# Patient Record
Sex: Female | Born: 1980 | Race: Black or African American | Hispanic: No | Marital: Single | Smoking: Never smoker
Health system: Southern US, Community
[De-identification: ages and names within clinical notes are randomized; demographics above are authoritative.]

## PROBLEM LIST (undated history)

## (undated) DIAGNOSIS — I10 Essential (primary) hypertension: Secondary | ICD-10-CM

---

## 2015-04-07 ENCOUNTER — Emergency Department (HOSPITAL_BASED_OUTPATIENT_CLINIC_OR_DEPARTMENT_OTHER)
Admission: EM | Admit: 2015-04-07 | Discharge: 2015-04-07 | Disposition: A | Discharge status: Home / Self Care | Payer: Medicaid Other | Attending: Emergency Medicine | Admitting: Emergency Medicine

## 2015-04-07 ENCOUNTER — Encounter (HOSPITAL_BASED_OUTPATIENT_CLINIC_OR_DEPARTMENT_OTHER): Payer: Self-pay | Admitting: *Deleted

## 2015-04-07 DIAGNOSIS — R200 Anesthesia of skin: Secondary | ICD-10-CM | POA: Diagnosis not present

## 2015-04-07 DIAGNOSIS — G5601 Carpal tunnel syndrome, right upper limb: Secondary | ICD-10-CM | POA: Diagnosis not present

## 2015-04-07 DIAGNOSIS — M25531 Pain in right wrist: Secondary | ICD-10-CM | POA: Diagnosis present

## 2015-04-07 MED ORDER — IBUPROFEN 600 MG PO TABS: 600.0000 mg | ORAL_TABLET | Freq: Four times a day (QID) | ORAL | Status: AC | PRN

## 2015-04-07 NOTE — Discharge Instructions (Signed)

## 2015-04-07 NOTE — ED Notes (Signed)
Presents with rt wrist pain with tingling in digits

## 2015-04-07 NOTE — ED Notes (Signed)
Rt wrist pain, x 2 weeks, pain in digits with numbness that radiates to rt wrist

## 2015-04-07 NOTE — ED Notes (Signed)
Wrist splint applied to rt wrist per EDP orders, pt teaching done re: application and use of splint, pt verbalized understanding of splint, ice pack also provided, pt teaching done re: time limit use on ice usage, also alt methods for pain control, including ice, rest, splint and elevation

## 2015-04-07 NOTE — ED Provider Notes (Signed)
CSN: [id number]     Arrival date & time 04/07/15  7829 History   First MD Initiated Contact with Patient 04/07/15 [phone]     Chief Complaint  Patient presents with  . Wrist Pain     (Consider location/radiation/quality/duration/timing/severity/associated sxs/prior Treatment) Patient is a 34 y.o. female presenting with wrist pain. The history is provided by the patient.  Wrist Pain This is a new problem. Episode onset: 2 weeks ago. The problem occurs constantly. The problem has not changed since onset.Pertinent negatives include no abdominal pain. Exacerbated by: Repetitive movement working in [employer]. Nothing relieves the symptoms. She has tried nothing for the symptoms.    History reviewed. No pertinent past medical history. History reviewed. No pertinent past surgical history. No family history on file. Social History  Substance Use Topics  . Smoking status: Never Smoker   . Smokeless tobacco: None  . Alcohol Use: No   OB History    No data available     Review of Systems  Gastrointestinal: Negative for abdominal pain.  All other systems reviewed and are negative.     Allergies  Review of patient's allergies indicates no known allergies.  Home Medications   Prior to Admission medications   Medication Sig Start Date End Date Taking? Authorizing Provider  ibuprofen (ADVIL,MOTRIN) 600 MG tablet Take 1 tablet (600 mg total) by mouth every 6 (six) hours as needed for mild pain. 04/07/15   Lyndal Pulley, MD   BP 146/93 mmHg  Pulse 90  Temp(Src) 98.7 F (37.1 C) (Oral)  Resp 18  Ht  (1.575 m)  Wt 200 lb (90.719 kg)  BMI 36.57 kg/m2  SpO2 100% Physical Exam  Constitutional: She is oriented to person, place, and time. She appears well-developed and well-nourished. No distress.  HENT:  Head: Normocephalic.  Eyes: Conjunctivae are normal.  Neck: Neck supple. No tracheal deviation present.  Cardiovascular: Normal rate and regular rhythm.    Pulmonary/Chest: Effort normal. No respiratory distress.  Abdominal: Soft. She exhibits no distension.  Musculoskeletal:       Right wrist: She exhibits tenderness (with percussion of carpal tunnel eliciting symptoms).  Neurological: She is alert and oriented to person, place, and time.  Skin: Skin is warm and dry.  Psychiatric: She has a normal mood and affect.    ED Course  Procedures (including critical care time) Labs Review Labs Reviewed - No data to display  Imaging Review No results found. I have personally reviewed and evaluated these images and lab results as part of my medical decision-making.   EKG Interpretation None      MDM   Final diagnoses:  Acute carpal tunnel syndrome, right    34 year old female presents with reproducible wrist pain, finger numbness, and radiating pain up her right arm. Signs and symptoms consistent with uncomplicated carpal tunnel syndrome. Recommended NSAIDs, splinting, close PCP follow-up and surgical consultation if conservative therapy is failed.    Lyndal Pulley, MD 04/07/15 [phone]

## 2015-04-10 ENCOUNTER — Emergency Department (HOSPITAL_BASED_OUTPATIENT_CLINIC_OR_DEPARTMENT_OTHER)
Admission: EM | Admit: 2015-04-10 | Discharge: 2015-04-10 | Disposition: A | Discharge status: Home / Self Care | Payer: Medicaid Other | Attending: Emergency Medicine | Admitting: Emergency Medicine

## 2015-04-10 ENCOUNTER — Emergency Department (HOSPITAL_BASED_OUTPATIENT_CLINIC_OR_DEPARTMENT_OTHER): Payer: Medicaid Other

## 2015-04-10 ENCOUNTER — Encounter (HOSPITAL_BASED_OUTPATIENT_CLINIC_OR_DEPARTMENT_OTHER): Payer: Self-pay

## 2015-04-10 DIAGNOSIS — N390 Urinary tract infection, site not specified: Secondary | ICD-10-CM

## 2015-04-10 DIAGNOSIS — O2331 Infections of other parts of urinary tract in pregnancy, first trimester: Secondary | ICD-10-CM | POA: Diagnosis not present

## 2015-04-10 DIAGNOSIS — Z3490 Encounter for supervision of normal pregnancy, unspecified, unspecified trimester: Secondary | ICD-10-CM

## 2015-04-10 DIAGNOSIS — O9989 Other specified diseases and conditions complicating pregnancy, childbirth and the puerperium: Secondary | ICD-10-CM | POA: Diagnosis present

## 2015-04-10 DIAGNOSIS — R109 Unspecified abdominal pain: Secondary | ICD-10-CM

## 2015-04-10 DIAGNOSIS — Z3A01 Less than 8 weeks gestation of pregnancy: Secondary | ICD-10-CM | POA: Diagnosis not present

## 2015-04-10 LAB — URINE MICROSCOPIC-ADD ON

## 2015-04-10 LAB — HCG, QUANTITATIVE, PREGNANCY: hCG, Beta Chain, Quant, S: 10771 m[IU]/mL — ABNORMAL HIGH (ref <5)

## 2015-04-10 LAB — URINALYSIS, ROUTINE W REFLEX MICROSCOPIC
BILIRUBIN URINE: NEGATIVE
Glucose, UA: NEGATIVE mg/dL
Hgb urine dipstick: NEGATIVE
KETONES UR: 15 mg/dL — AB
NITRITE: NEGATIVE
PROTEIN: NEGATIVE mg/dL
Specific Gravity, Urine: 1.021 (ref 1.005–1.030)
UROBILINOGEN UA: 1 mg/dL (ref 0.0–1.0)
pH: 6 (ref 5.0–8.0)

## 2015-04-10 LAB — PREGNANCY, URINE: PREG TEST UR: POSITIVE — AB

## 2015-04-10 MED ORDER — CEPHALEXIN 500 MG PO CAPS: 500.0000 mg | ORAL_CAPSULE | Freq: Two times a day (BID) | ORAL | Status: AC

## 2015-04-10 NOTE — ED Provider Notes (Signed)
CSN: [id number]     Arrival date & time 04/10/15  1108 History   First MD Initiated Contact with Patient 04/10/15 1336     Chief Complaint  Patient presents with  . Abdominal Pain     (Consider location/radiation/quality/duration/timing/severity/associated sxs/prior Treatment) HPI Comments: Patient is a 34 year old female who presents to the ED with complaint of lower abdominal pain, onset 3 days. Patient reports having intermittent lower abdominal cramping. Denies any aggravating or alleviating factors. Denies fever, chills, headache, SOB, CP, cough, sore throat, nausea, vomiting, diarrhea, vaginal bleeding, vaginal discharge, urinary symptoms, blood in urine or stool. Patient reports she took a home pregnancy test yesterday that was positive. She has not followed up with OB/GYN yet. Denies any prior abdominal surgeries. Patient notes she has been taking ibuprofen at home with mild relief. LMP 9/4.   Patient is a 34 y.o. female presenting with abdominal pain.  Abdominal Pain   History reviewed. No pertinent past medical history. History reviewed. No pertinent past surgical history. No family history on file. Social History  Substance Use Topics  . Smoking status: Never Smoker   . Smokeless tobacco: None  . Alcohol Use: No   OB History    No data available     Review of Systems  Gastrointestinal: Positive for abdominal pain.  All other systems reviewed and are negative.     Allergies  Review of patient's allergies indicates no known allergies.  Home Medications   Prior to Admission medications   Medication Sig Start Date End Date Taking? Authorizing Provider  ibuprofen (ADVIL,MOTRIN) 600 MG tablet Take 1 tablet (600 mg total) by mouth every 6 (six) hours as needed for mild pain. 04/07/15   Lyndal Pulleyaniel Knott, MD   BP 143/93 mmHg  Pulse 102  Temp(Src) 99.4 F (37.4 C) (Oral)  Resp 18  Ht 5\' 2"  (1.575 m)  Wt 200 lb (90.719 kg)  BMI 36.57 kg/m2  SpO2 100%  LMP  02/28/2015 Physical Exam  Constitutional: She is oriented to person, place, and time. She appears well-developed and well-nourished.  HENT:  Head: Normocephalic and atraumatic.  Mouth/Throat: Oropharynx is clear and moist. No oropharyngeal exudate.  Eyes: Conjunctivae and EOM are normal. Pupils are equal, round, and reactive to light. Right eye exhibits no discharge. Left eye exhibits no discharge. No scleral icterus.  Neck: Normal range of motion. Neck supple.  Cardiovascular: Normal rate, regular rhythm, normal heart sounds and intact distal pulses.   Pulmonary/Chest: Effort normal and breath sounds normal. No respiratory distress. She has no wheezes. She has no rales. She exhibits no tenderness.  Abdominal: Soft. Bowel sounds are normal. She exhibits no distension and no mass. There is tenderness (RLQ, LLQ, suprapubic). There is no rebound and no guarding.  Musculoskeletal: Normal range of motion. She exhibits no edema or tenderness.  Lymphadenopathy:    She has no cervical adenopathy.  Neurological: She is alert and oriented to person, place, and time.  Skin: Skin is warm and dry.  Nursing note and vitals reviewed.   ED Course  Procedures (including critical care time) Labs Review Labs Reviewed  URINALYSIS, ROUTINE W REFLEX MICROSCOPIC (NOT AT [facility]) - Abnormal; Notable for the following:    APPearance CLOUDY (*)    Ketones, ur 15 (*)    Leukocytes, UA MODERATE (*)    All other components within normal limits  PREGNANCY, URINE - Abnormal; Notable for the following:    Preg Test, Ur POSITIVE (*)    All other components within normal  limits  URINE MICROSCOPIC-ADD ON - Abnormal; Notable for the following:    Squamous Epithelial / LPF MANY (*)    Bacteria, UA FEW (*)    All other components within normal limits    Imaging Review No results found. I have personally reviewed and evaluated these images and lab results as part of my medical decision-making.  Filed Vitals:    04/10/15 1614  BP: 122/69  Pulse: 90  Temp: 99 F (37.2 C)  Resp: 18     MDM   Final diagnoses:  Pregnant  UTI (lower urinary tract infection)    Patient presents with lower abdominal cramping. LMP 9/4. Denies urinary symptoms, vaginal bleeding, vaginal discharge, fever. Mild relief with ibuprofen at home. Exam revealed mild TTP at lower abdominal quadrants, no peritoneal signs. Urine pregnancy positive. HCG Quant [id number]. UA consistent with UTI. Ultrasound ultrasound revealed intrauterine gestational sac with visible yolk sac but embryo not yet visible. Discussed results with patient. Plan to discharge patient home with antibiotics for UTI and advised patient to follow up with OB/GYN this week.  Evaluation does not show pathology requring ongoing emergent intervention or admission. Pt is hemodynamically stable and mentating appropriately. Discussed findings/results and plan with patient/guardian, who agrees with plan. All questions answered. Return precautions discussed and outpatient follow up given.      Satira Sark [place], [place] 04/10/15 1654  Vanetta Mulders, MD 04/11/15 [phone]

## 2015-04-10 NOTE — Discharge Instructions (Signed)
Take your medications as prescribe for 10 days for UTI. You may also take over the counter Ibuprofen for abdominal pain as needed. Please follow up with an OBGYN in the next week. Please return to the Emergency Department if symptoms worsen or new onset of fever, vaginal bleeding, vomiting.

## 2015-04-10 NOTE — ED Notes (Addendum)
C/o abd pain x 3 days-denies n/v/d, and vaginal d/c-+dysuria-positive home preg test

## 2015-04-10 NOTE — ED Notes (Signed)
Patient transported to Ultrasound 

## 2015-07-11 ENCOUNTER — Encounter (HOSPITAL_BASED_OUTPATIENT_CLINIC_OR_DEPARTMENT_OTHER): Payer: Self-pay | Admitting: *Deleted

## 2015-07-11 ENCOUNTER — Emergency Department (HOSPITAL_BASED_OUTPATIENT_CLINIC_OR_DEPARTMENT_OTHER)
Admission: EM | Admit: 2015-07-11 | Discharge: 2015-07-11 | Disposition: A | Discharge status: Home / Self Care | Payer: Medicaid Other | Attending: Emergency Medicine | Admitting: Emergency Medicine

## 2015-07-11 DIAGNOSIS — R109 Unspecified abdominal pain: Secondary | ICD-10-CM | POA: Diagnosis not present

## 2015-07-11 DIAGNOSIS — Z3A18 18 weeks gestation of pregnancy: Secondary | ICD-10-CM | POA: Diagnosis not present

## 2015-07-11 DIAGNOSIS — R102 Pelvic and perineal pain: Secondary | ICD-10-CM | POA: Diagnosis not present

## 2015-07-11 DIAGNOSIS — O9989 Other specified diseases and conditions complicating pregnancy, childbirth and the puerperium: Secondary | ICD-10-CM | POA: Diagnosis present

## 2015-07-11 DIAGNOSIS — N949 Unspecified condition associated with female genital organs and menstrual cycle: Secondary | ICD-10-CM

## 2015-07-11 LAB — URINALYSIS, ROUTINE W REFLEX MICROSCOPIC
Bilirubin Urine: NEGATIVE
Glucose, UA: NEGATIVE mg/dL
HGB URINE DIPSTICK: NEGATIVE
Ketones, ur: NEGATIVE mg/dL
Nitrite: NEGATIVE
PH: 6.5 (ref 5.0–8.0)
PROTEIN: NEGATIVE mg/dL
SPECIFIC GRAVITY, URINE: 1.014 (ref 1.005–1.030)

## 2015-07-11 LAB — URINE MICROSCOPIC-ADD ON: RBC / HPF: NONE SEEN RBC/hpf (ref 0–5)

## 2015-07-11 NOTE — ED Notes (Signed)
[redacted] weeks pregnant. C.o abdominal pain and pressure in her left abdomen when she urinates. Denies vaginal leakage.

## 2015-07-11 NOTE — Discharge Instructions (Signed)
How to Take a Sitz Bath  A sitz bath is a warm water bath that is taken while you are sitting down. The water should only come up to your hips and should cover your buttocks. Your health care provider may recommend a sitz bath to help you:   · Clean the lower part of your body, including your genital area.  · With itching.  · With pain.  · With sore muscles or muscles that tighten or spasm.  HOW TO TAKE A SITZ BATH  Take 3-4 sitz baths per day or as told by your health care provider.  1. Partially fill a bathtub with warm water. You will only need the water to be deep enough to cover your hips and buttocks when you are sitting in it.  2. If your health care provider told you to put medicine in the water, follow the directions exactly.  3. Sit in the water and open the tub drain a little.  4. Turn on the warm water again to keep the tub at the correct level. Keep the water running constantly.  5. Soak in the water for 15-20 minutes or as told by your health care provider.  6. After the sitz bath, pat the affected area dry first. Do not rub it.  7. Be careful when you stand up after the sitz bath because you may feel dizzy.  SEEK MEDICAL CARE IF:  · Your symptoms get worse. Do not continue with sitz baths if your symptoms get worse.  · You have new symptoms. Do not continue with sitz baths until you talk with your health care provider.     This information is not intended to replace advice given to you by your health care provider. Make sure you discuss any questions you have with your health care provider.     Document Released: 03/07/2004 Document Revised: 10/30/2014 Document Reviewed: 06/13/2014  Elsevier Interactive Patient Education ©2016 [employer]

## 2015-07-11 NOTE — ED Provider Notes (Signed)
CSN: [id number]     Arrival date & time 07/11/15  1520 History   First MD Initiated Contact with Patient 07/11/15 1600     Chief Complaint  Patient presents with  . Abdominal Pain     HPI [redacted] weeks pregnant. C.o abdominal pain and pressure in her left abdomen when she urinates. Denies vaginal leakage.  Patient has had an ultrasound done on this pregnancy which shows an intrauterine pregnancy.  Patient denies any fever or chills. History reviewed. No pertinent past medical history. History reviewed. No pertinent past surgical history. No family history on file. Social History  Substance Use Topics  . Smoking status: Never Smoker   . Smokeless tobacco: None  . Alcohol Use: No   OB History    Gravida Para Term Preterm AB TAB SAB Ectopic Multiple Living   1              Review of Systems    Allergies  Review of patient's allergies indicates no known allergies.  Home Medications   Prior to Admission medications   Medication Sig Start Date End Date Taking? Authorizing Provider  Prenatal Multivit-Min-Fe-FA (PRENATAL VITAMINS PO) Take by mouth.   Yes Historical Provider, MD  ibuprofen (ADVIL,MOTRIN) 600 MG tablet Take 1 tablet (600 mg total) by mouth every 6 (six) hours as needed for mild pain. 04/07/15   Lyndal Pulleyaniel Knott, MD   BP 142/90 mmHg  Pulse 100  Temp(Src) 98.4 F (36.9 C) (Oral)  Resp 20  Ht 5' (1.524 m)  Wt 200 lb (90.719 kg)  BMI 39.06 kg/m2  SpO2 98%  LMP 03/03/2015 Physical Exam  Constitutional: She is oriented to person, place, and time. She appears well-developed and well-nourished. No distress.  HENT:  Head: Normocephalic and atraumatic.  Eyes: Pupils are equal, round, and reactive to light.  Neck: Normal range of motion.  Cardiovascular: Normal rate and intact distal pulses.   Pulmonary/Chest: No respiratory distress.  Abdominal: Normal appearance. She exhibits no distension. There is no rebound and no CVA tenderness.    Musculoskeletal: Normal range of  motion.  Neurological: She is alert and oriented to person, place, and time. No cranial nerve deficit.  Skin: Skin is warm and dry. No rash noted.  Psychiatric: She has a normal mood and affect. Her behavior is normal.  Nursing note and vitals reviewed.   ED Course  Procedures (including critical care time) Labs Review Labs Reviewed  URINALYSIS, ROUTINE W REFLEX MICROSCOPIC (NOT AT [facility]) - Abnormal; Notable for the following:    Leukocytes, UA TRACE (*)    All other components within normal limits  URINE MICROSCOPIC-ADD ON - Abnormal; Notable for the following:    Squamous Epithelial / LPF 6-30 (*)    Bacteria, UA RARE (*)    All other components within normal limits  URINE CULTURE    Imaging Review No results found. I have personally reviewed and evaluated these images and lab results as part of my medical decision-making.    MDM   Final diagnoses:  Round ligament pain        Nelva Nayobert Tramayne Sebesta, MD 07/11/15 1727

## 2015-07-14 LAB — URINE CULTURE: SPECIAL REQUESTS: NORMAL

## 2016-07-22 ENCOUNTER — Emergency Department (HOSPITAL_BASED_OUTPATIENT_CLINIC_OR_DEPARTMENT_OTHER)
Admission: EM | Admit: 2016-07-22 | Discharge: 2016-07-22 | Disposition: A | Discharge status: Home / Self Care | Payer: Medicaid Other | Attending: Emergency Medicine | Admitting: Emergency Medicine

## 2016-07-22 ENCOUNTER — Encounter (HOSPITAL_BASED_OUTPATIENT_CLINIC_OR_DEPARTMENT_OTHER): Payer: Self-pay | Admitting: *Deleted

## 2016-07-22 DIAGNOSIS — I10 Essential (primary) hypertension: Secondary | ICD-10-CM | POA: Insufficient documentation

## 2016-07-22 DIAGNOSIS — R519 Headache, unspecified: Secondary | ICD-10-CM

## 2016-07-22 DIAGNOSIS — R51 Headache: Secondary | ICD-10-CM | POA: Insufficient documentation

## 2016-07-22 HISTORY — DX: Essential (primary) hypertension: I10

## 2016-07-22 MED ORDER — METOCLOPRAMIDE HCL 10 MG PO TABS
10.0000 mg | ORAL_TABLET | Freq: Once | ORAL | Status: AC
  Administered 2016-07-22: 10 mg via ORAL
  Filled 2016-07-22: qty 1

## 2016-07-22 MED ORDER — OXYCODONE-ACETAMINOPHEN 5-325 MG PO TABS
1.0000 | ORAL_TABLET | ORAL | Status: DC | PRN
  Administered 2016-07-22: 1 via ORAL
  Filled 2016-07-22: qty 1

## 2016-07-22 MED ORDER — KETOROLAC TROMETHAMINE 60 MG/2ML IM SOLN
60.0000 mg | Freq: Once | INTRAMUSCULAR | Status: AC
  Administered 2016-07-22: 60 mg via INTRAMUSCULAR
  Filled 2016-07-22: qty 2

## 2016-07-22 MED ORDER — DIPHENHYDRAMINE HCL 25 MG PO CAPS
25.0000 mg | ORAL_CAPSULE | Freq: Once | ORAL | Status: AC
  Administered 2016-07-22: 25 mg via ORAL
  Filled 2016-07-22: qty 1

## 2016-07-22 NOTE — ED Provider Notes (Signed)
[facility]   CSN: [id number] Arrival date & time: 07/22/16  1008     History   Chief Complaint Chief Complaint  Patient presents with  . Headache    HPI Veronica Henry is a 36 y.o. female presenting with 3 days of pounding unilateral headache. She has tried tylenol without relief. She states that she has had this type of headache in the past, but not in a while. She was given percocet in triage and improved but still had a headache. She endorses photophobia, denies nausea, vomiting, or other symptoms.  HPI  Past Medical History:  Diagnosis Date  . Hypertension     There are no active problems to display for this patient.   History reviewed. No pertinent surgical history.  OB History    Gravida Para Term Preterm AB Living   1             SAB TAB Ectopic Multiple Live Births                   Home Medications    Prior to Admission medications   Medication Sig Start Date End Date Taking? Authorizing Provider  ibuprofen (ADVIL,MOTRIN) 600 MG tablet Take 1 tablet (600 mg total) by mouth every 6 (six) hours as needed for mild pain. 04/07/15   Lyndal Pulley, MD    Family History History reviewed. No pertinent family history.  Social History Social History  Substance Use Topics  . Smoking status: Never Smoker  . Smokeless tobacco: Never Used  . Alcohol use No     Allergies   Patient has no known allergies.   Review of Systems Review of Systems  Constitutional: Negative for chills and fever.  HENT: Negative for congestion, ear pain, facial swelling, sinus pain, sinus pressure and sore throat.   Eyes: Positive for photophobia. Negative for pain and visual disturbance.  Respiratory: Negative for cough, shortness of breath, wheezing and stridor.   Cardiovascular: Negative for chest pain and palpitations.  Gastrointestinal: Negative for abdominal distention, abdominal pain, diarrhea, nausea and vomiting.  Genitourinary: Negative  for dysuria and hematuria.  Musculoskeletal: Negative for arthralgias, back pain, gait problem, myalgias, neck pain and neck stiffness.  Skin: Negative for color change, pallor and rash.  Neurological: Positive for headaches. Negative for dizziness, seizures, syncope, facial asymmetry, speech difficulty, weakness, light-headedness and numbness.  All other systems reviewed and are negative.    Physical Exam Updated Vital Signs BP 130/92 (BP Location: Right Arm)   Pulse 72   Temp 97.7 F (36.5 C) (Oral)   Resp 18   Ht 5' (1.524 m)   Wt 95.3 kg   LMP 07/16/2016   SpO2 99%   BMI 41.01 kg/m   Physical Exam  Constitutional: She appears well-developed and well-nourished. No distress.  Afebrile, nontoxic-appearing, sitting comfortably in bed in no acute distress.  HENT:  Head: Normocephalic and atraumatic.  Mouth/Throat: Oropharynx is clear and moist. No oropharyngeal exudate.  Eyes: Conjunctivae and EOM are normal. Pupils are equal, round, and reactive to light. Right eye exhibits no discharge. Left eye exhibits no discharge.  Neck: Normal range of motion. Neck supple.  Cardiovascular: Normal rate, regular rhythm and normal heart sounds.   No murmur heard. Pulmonary/Chest: Effort normal and breath sounds normal. No respiratory distress. She has no wheezes. She has no rales.  Musculoskeletal: She exhibits no edema.  Neurological: She is alert. No cranial nerve deficit or sensory deficit. She exhibits normal muscle tone. Coordination  normal.  Skin: Skin is warm and dry. No rash noted. She is not diaphoretic. No pallor.  Psychiatric: She has a normal mood and affect. Her behavior is normal.  Nursing note and vitals reviewed.    ED Treatments / Results  Labs (all labs ordered are listed, but only abnormal results are displayed) Labs Reviewed - No data to display  EKG  EKG Interpretation None       Radiology No results found.  Procedures Procedures (including critical  care time)  Medications Ordered in ED Medications  ketorolac (TORADOL) injection 60 mg (60 mg Intramuscular Given 07/22/16 1251)  metoCLOPramide (REGLAN) tablet 10 mg (10 mg Oral Given 07/22/16 1250)  diphenhydrAMINE (BENADRYL) capsule 25 mg (25 mg Oral Given 07/22/16 1250)     Initial Impression / Assessment and Plan / ED Course  I have reviewed the triage vital signs and the nursing notes.  Pertinent labs & imaging results that were available during my care of the patient were reviewed by me and considered in my medical decision making (see chart for details).    Patient present with unilateral headache with photophobia. No aura. Reassuring exam. No gross focal neuro-deficits. She was afebrile, non-toxic appearing and in no acute distress. No meningeal signs.  On reassessment, patient reported feeling much better. Her condition overall improved while in the ED. Vitals stable. She was ready to go home.  Discharge home with close follow up with PCP. Patient was provided with resources to establish care. Advised to remain hydrated and emphasized importance of follow up with primary care.  Discussed strict return precautions. Patient was advised to return to the emergency department if experiencingworsening or new concerning symptoms. She understood instructions and agreed with discharge plan.   Final Clinical Impressions(s) / ED Diagnoses   Final diagnoses:  Acute nonintractable headache, unspecified headache type    New Prescriptions Discharge Medication List as of 07/22/2016  1:42 PM       Georgiana ShoreJessica B Sederick Jacobsen, PA-C 07/23/16 1450    Jerelyn ScottMartha Linker, MD 07/23/16 [phone]

## 2016-07-22 NOTE — ED Triage Notes (Signed)
Pt reports headache with photophobia x Sunday, states she has had the same kind of headache in the past, but has not been seen by a neurologist.

## 2017-02-12 ENCOUNTER — Encounter (HOSPITAL_BASED_OUTPATIENT_CLINIC_OR_DEPARTMENT_OTHER): Payer: Self-pay

## 2017-02-12 ENCOUNTER — Emergency Department (HOSPITAL_BASED_OUTPATIENT_CLINIC_OR_DEPARTMENT_OTHER)
Admission: EM | Admit: 2017-02-12 | Discharge: 2017-02-12 | Disposition: A | Discharge status: Home / Self Care | Payer: Medicaid Other | Attending: Emergency Medicine | Admitting: Emergency Medicine

## 2017-02-12 DIAGNOSIS — G43009 Migraine without aura, not intractable, without status migrainosus: Secondary | ICD-10-CM | POA: Insufficient documentation

## 2017-02-12 DIAGNOSIS — I10 Essential (primary) hypertension: Secondary | ICD-10-CM | POA: Insufficient documentation

## 2017-02-12 MED ORDER — DIPHENHYDRAMINE HCL 25 MG PO CAPS
25.0000 mg | ORAL_CAPSULE | Freq: Once | ORAL | Status: AC
  Administered 2017-02-12: 25 mg via ORAL
  Filled 2017-02-12: qty 1

## 2017-02-12 MED ORDER — METOCLOPRAMIDE HCL 10 MG PO TABS
10.0000 mg | ORAL_TABLET | Freq: Once | ORAL | Status: AC
  Administered 2017-02-12: 10 mg via ORAL
  Filled 2017-02-12: qty 1

## 2017-02-12 MED ORDER — KETOROLAC TROMETHAMINE 30 MG/ML IJ SOLN
30.0000 mg | Freq: Once | INTRAMUSCULAR | Status: AC
  Administered 2017-02-12: 30 mg via INTRAMUSCULAR
  Filled 2017-02-12: qty 1

## 2017-02-12 NOTE — ED Triage Notes (Signed)
Pt reports severe headache since last night states that she feels it may be due to her high blood pressure, cannot afford her medication. Denies vomiting. Endorses nausea and light sensitivity.

## 2017-02-12 NOTE — Discharge Instructions (Signed)
You were seen in the ED for a migraine headache and treated with medications. Please call and establish with a primary care doctor, contact information is below for some options of where to go.

## 2017-02-12 NOTE — ED Provider Notes (Signed)
Pt seen and evaluated. D/W Dr. Artist Pais. Pt with Bilateral throbbing frontal headache similar to previous Ha. Normal exam-Hypertensive, mild. Responded well to Toradol in January.  Plan:  Medicate, re-eval of sx and htn.    [name], MD 02/12/17 [phone]

## 2017-02-12 NOTE — ED Provider Notes (Signed)
[facility] Provider [facility]   CSN: [id number] Arrival date & time: 02/12/17  8182     History   Chief Complaint Chief Complaint  Patient presents with  . Headache    HPI Veronica Henry is a 36 y.o. female.  Patient 36 yo F presents to ED with c/o HA. Started last night, diffusely across front going back, pounding sensation. Has some light sensitivity and ringing in Henry ears. Has HA similar to this about 1 time per month, usually relieved by tylenol but this episode has persisted. Also having some nausea but no vomiting. Thinks related to high BP because when she gets these checks BP at home and find it to be elevated, this morning was 145/97. No vision changes, numbness, weakness, tingling. States last time had HA to this severity was relieved by toradol.      Past Medical History:  Diagnosis Date  . Hypertension     There are no active problems to display for this patient.   History reviewed. No pertinent surgical history.  OB History    Gravida Para Term Preterm AB Living   1             SAB TAB Ectopic Multiple Live Births                   Home Medications    Prior to Admission medications   Medication Sig Start Date End Date Taking? Authorizing Provider  ibuprofen (ADVIL,MOTRIN) 600 MG tablet Take 1 tablet (600 mg total) by mouth every 6 (six) hours as needed for mild pain. 04/07/15   Lyndal Pulley, MD    Family History No family history on file.  Social History Social History  Substance Use Topics  . Smoking status: Never Smoker  . Smokeless tobacco: Never Used  . Alcohol use No     Allergies   Patient has no known allergies.   Review of Systems Review of Systems  Constitutional: Negative for chills and fever.  Eyes: Positive for photophobia. Negative for visual disturbance.  Respiratory: Negative for shortness of breath.   Cardiovascular: Negative for chest pain.  Gastrointestinal: Positive for nausea. Negative for abdominal  pain, constipation, diarrhea and vomiting.  Neurological: Positive for headaches. Negative for dizziness, weakness, light-headedness and numbness.     Physical Exam Updated Vital Signs BP (!) 155/104   Pulse 80   Temp 98.4 F (36.9 C) (Oral)   Resp 16   Ht 5' (1.524 m)   Wt 90.7 kg (200 lb)   LMP 01/29/2017   SpO2 100%   Breastfeeding? Unknown   BMI 39.06 kg/m   Physical Exam  Constitutional: She is oriented to person, place, and time. She appears well-developed and well-nourished. No distress.  HENT:  Head: Normocephalic and atraumatic.  Nose: Nose normal.  Eyes: Pupils are equal, round, and reactive to light. Conjunctivae and EOM are normal.  Neck: Normal range of motion. Neck supple.  Cardiovascular: Normal rate, regular rhythm, normal heart sounds and intact distal pulses.   No murmur heard. Pulmonary/Chest: Effort normal and breath sounds normal. No respiratory distress. She has no wheezes.  Abdominal: Soft. Bowel sounds are normal. She exhibits no distension. There is no tenderness. There is no rebound and no guarding.  Musculoskeletal: Normal range of motion. She exhibits no edema.  Lymphadenopathy:    She has no cervical adenopathy.  Neurological: She is alert and oriented to person, place, and time. She displays normal reflexes. No cranial nerve deficit or sensory  deficit. She exhibits normal muscle tone. Coordination normal.  Skin: Skin is warm and dry. Capillary refill takes less than 2 seconds.  Psychiatric: She has a normal mood and affect.     ED Treatments / Results  Labs (all labs ordered are listed, but only abnormal results are displayed) Labs Reviewed - No data to display  EKG  EKG Interpretation None       Radiology No results found.  Procedures Procedures (including critical care time)  Medications Ordered in ED Medications  ketorolac (TORADOL) 30 MG/ML injection 30 mg (not administered)  diphenhydrAMINE (BENADRYL) capsule 25 mg (not  administered)  metoCLOPramide (REGLAN) tablet 10 mg (not administered)     Initial Impression / Assessment and Plan / ED Course  I have reviewed the triage vital signs and the nursing notes.  Pertinent labs & imaging results that were available during my care of the patient were reviewed by me and considered in my medical decision making (see chart for details).   Patient is a 36 yo F who presented to ED with frontal headache with photophobia and nausea similar to last ED visit. No neuro findings on exam and overall well appearing. Given toradol, reglan and benadryl with relief. BP recheck 132/90 manually. Discussed establishing with a PCP to follow up as outpatient.   Final Clinical Impressions(s) / ED Diagnoses   Final diagnoses:  Migraine without aura and without status migrainosus, not intractable    New Prescriptions New Prescriptions   No medications on file     Veronica Her, DO 02/12/17 4098    Veronica Porter, MD 02/20/17 [phone]

## 2017-07-07 ENCOUNTER — Emergency Department (HOSPITAL_BASED_OUTPATIENT_CLINIC_OR_DEPARTMENT_OTHER)
Admission: EM | Admit: 2017-07-07 | Discharge: 2017-07-07 | Disposition: A | Discharge status: Home / Self Care | Payer: Self-pay | Attending: Emergency Medicine | Admitting: Emergency Medicine

## 2017-07-07 ENCOUNTER — Other Ambulatory Visit: Payer: Self-pay

## 2017-07-07 ENCOUNTER — Encounter (HOSPITAL_BASED_OUTPATIENT_CLINIC_OR_DEPARTMENT_OTHER): Payer: Self-pay | Admitting: Emergency Medicine

## 2017-07-07 DIAGNOSIS — J069 Acute upper respiratory infection, unspecified: Secondary | ICD-10-CM | POA: Insufficient documentation

## 2017-07-07 DIAGNOSIS — I10 Essential (primary) hypertension: Secondary | ICD-10-CM | POA: Insufficient documentation

## 2017-07-07 NOTE — ED Triage Notes (Signed)
States has had a nose bleed at hs since last Thursday. Also concerned about BP being high, at work it was 154/111, this am. Was sent over from work

## 2017-07-07 NOTE — ED Provider Notes (Signed)
[facility] EMERGENCY DEPARTMENT Provider Note   CSN: [id number] Arrival date & time: 07/07/17  [id number]     History   Chief Complaint Chief Complaint  Patient presents with  . Hypertension    nose bleed     HPI Veronica Henry is a 37 y.o. female.  Patient has had cold with cough sneeze and rhinorrhea for the past 3 days.  She reports her nosebleeds from both sides when she blows her nose.  Last had nosebleed 4 AM today.  She is presently asymptomatic except for nasal congestion.  No treatment prior to coming here.  She took her blood pressure this morning at work which was noted to be 154/111.  She is been told about elevated blood pressure in the past however has not been on medication.  HPI  Past Medical History:  Diagnosis Date  . Hypertension     There are no active problems to display for this patient.   History reviewed. No pertinent surgical history.  OB History    Gravida Para Term Preterm AB Living   1             SAB TAB Ectopic Multiple Live Births                   Home Medications    Prior to Admission medications   Medication Sig Start Date End Date Taking? Authorizing Provider  ibuprofen (ADVIL,MOTRIN) 600 MG tablet Take 1 tablet (600 mg total) by mouth every 6 (six) hours as needed for mild pain. 04/07/15   Lyndal PulleyKnott, Daniel, MD    Family History No family history on file.  Social History Social History   Tobacco Use  . Smoking status: Never Smoker  . Smokeless tobacco: Never Used  Substance Use Topics  . Alcohol use: Yes    Comment: social  . Drug use: No   Positive marijuana smoker no other illicit drug use  Allergies   Patient has no known allergies.   Review of Systems Review of Systems  Constitutional: Negative.   HENT: Positive for congestion, nosebleeds and sneezing.   Respiratory: Positive for cough.   Cardiovascular: Negative.   Gastrointestinal: Negative.   Musculoskeletal: Negative.   Skin: Negative.     Neurological: Negative.   Psychiatric/Behavioral: Negative.   All other systems reviewed and are negative.    Physical Exam Updated Vital Signs BP 134/87 (BP Location: Right Arm)   Pulse 85   Temp (!) 97.5 F (36.4 C) (Oral)   Resp 18   LMP 06/24/2017 (Exact Date)   SpO2 99%   Physical Exam  Constitutional: She is oriented to person, place, and time. She appears well-developed and well-nourished.  HENT:  Head: Normocephalic and atraumatic.  Nose inspected with headlamp and nasal speculum.  No bleeding site identified.  No dried blood at nares  Eyes: Conjunctivae are normal. Pupils are equal, round, and reactive to light.  Neck: Neck supple. No tracheal deviation present. No thyromegaly present.  Cardiovascular: Normal rate and regular rhythm.  No murmur heard. Pulmonary/Chest: Effort normal and breath sounds normal.  Abdominal: Soft. Bowel sounds are normal. She exhibits no distension. There is no tenderness.  Obese  Musculoskeletal: Normal range of motion. She exhibits no edema or tenderness.  Lymphadenopathy:    She has no cervical adenopathy.  Neurological: She is alert and oriented to person, place, and time. Coordination normal.  Skin: Skin is warm and dry. No rash noted.  Psychiatric: She has a normal mood  and affect.  Nursing note and vitals reviewed.    ED Treatments / Results  Labs (all labs ordered are listed, but only abnormal results are displayed) Labs Reviewed - No data to display  EKG  EKG Interpretation None       Radiology No results found.  Procedures Procedures (including critical care time)  Medications Ordered in ED Medications - No data to display   Initial Impression / Assessment and Plan / ED Course  I have reviewed the triage vital signs and the nursing notes.  Pertinent labs & imaging results that were available during my care of the patient were reviewed by me and considered in my medical decision making (see chart for  details).     I counseled patient for 5 minutes on smoking cessation I offered patient prescription for anti-hypertensive medication which she declined.  She would rather see primary care physician.  Okay with that his blood pressure has normalized.  Plan referral primary care  Final Clinical Impressions(s) / ED Diagnoses   Final diagnoses:  Hypertension, unspecified type  Upper respiratory tract infection, unspecified type   ED Discharge Orders    None       Doug Sou, MD 07/07/17 [phone]

## 2017-07-07 NOTE — Discharge Instructions (Signed)
Your blood pressure here today was 134/87.  Call the number on these instructions to get a primary care physician your blood pressure should be rechecked within the next 3 weeks.  Avoid marijuana, as smoking leads to colds another types of respiratory infections.  If your nose rebleeds pinch her nostrils shut for 20 minutes while sitting upright in a chair.  If you cannot get the bleeding to stop return to the emergency department

## 2018-06-05 ENCOUNTER — Other Ambulatory Visit: Payer: Self-pay

## 2018-06-05 ENCOUNTER — Encounter (HOSPITAL_BASED_OUTPATIENT_CLINIC_OR_DEPARTMENT_OTHER): Payer: Self-pay | Admitting: Emergency Medicine

## 2018-06-05 ENCOUNTER — Emergency Department (HOSPITAL_BASED_OUTPATIENT_CLINIC_OR_DEPARTMENT_OTHER)
Admission: EM | Admit: 2018-06-05 | Discharge: 2018-06-05 | Disposition: A | Discharge status: Home / Self Care | Payer: Worker's Compensation | Attending: Emergency Medicine | Admitting: Emergency Medicine

## 2018-06-05 DIAGNOSIS — S68626D Partial traumatic transphalangeal amputation of right little finger, subsequent encounter: Secondary | ICD-10-CM | POA: Insufficient documentation

## 2018-06-05 DIAGNOSIS — X58XXXD Exposure to other specified factors, subsequent encounter: Secondary | ICD-10-CM | POA: Diagnosis not present

## 2018-06-05 DIAGNOSIS — I10 Essential (primary) hypertension: Secondary | ICD-10-CM | POA: Insufficient documentation

## 2018-06-05 DIAGNOSIS — Z5189 Encounter for other specified aftercare: Secondary | ICD-10-CM

## 2018-06-05 MED ORDER — HYDROCODONE-ACETAMINOPHEN 5-325 MG PO TABS: 1.0000 | ORAL_TABLET | Freq: Four times a day (QID) | ORAL | 0 refills | Status: AC | PRN

## 2018-06-05 MED ORDER — CEPHALEXIN 500 MG PO CAPS: 500.0000 mg | ORAL_CAPSULE | Freq: Two times a day (BID) | ORAL | 0 refills | Status: AC

## 2018-06-05 MED ORDER — CEPHALEXIN 500 MG PO CAPS: 500.0000 mg | ORAL_CAPSULE | Freq: Two times a day (BID) | ORAL | 0 refills | Status: DC

## 2018-06-05 NOTE — ED Provider Notes (Signed)
[facility] EMERGENCY DEPARTMENT Provider Note   CSN: [id number] Arrival date & time: 06/05/18  1345     History   Chief Complaint Chief Complaint  Patient presents with  . Wound Check    HPI Veronica Henry is a 37 y.o. female with history of hypertension who presents with right small finger injury that occurred 5 days ago.  She had a distal fingertip amputation.  She saw the hand surgeon on Friday and was told it was healing well.  She woke up today with pain and drainage to the end of the digit.  She is currently taking Keflex.  She denies any fevers.  She reports that the drainage is improved now.  Patient saw orthopedic doctor at [facility].  She was initially evaluated at [facility].  HPI  Past Medical History:  Diagnosis Date  . Hypertension     There are no active problems to display for this patient.   History reviewed. No pertinent surgical history.   OB History    Gravida  1   Para      Term      Preterm      AB      Living        SAB      TAB      Ectopic      Multiple      Live Births               Home Medications    Prior to Admission medications   Medication Sig Start Date End Date Taking? Authorizing Provider  cephALEXin (KEFLEX) 500 MG capsule Take 1 capsule (500 mg total) by mouth 2 (two) times daily for 3 days. 06/05/18 06/08/18  Emi Holes, PA-C  HYDROcodone-acetaminophen (NORCO/VICODIN) 5-325 MG tablet Take 1-2 tablets by mouth every 6 (six) hours as needed. 06/05/18   Brier Firebaugh, Waylan Boga, PA-C  ibuprofen (ADVIL,MOTRIN) 600 MG tablet Take 1 tablet (600 mg total) by mouth every 6 (six) hours as needed for mild pain. 04/07/15   Lyndal Pulley, MD    Family History No family history on file.  Social History Social History   Tobacco Use  . Smoking status: Never Smoker  . Smokeless tobacco: Never Used  Substance Use Topics  . Alcohol use: Yes    Comment: social  . Drug use: No     Allergies    Patient has no known allergies.   Review of Systems Review of Systems  Musculoskeletal: Positive for arthralgias.  Skin: Positive for wound.     Physical Exam Updated Vital Signs BP (!) 165/85 (BP Location: Right Arm)   Pulse 77   Temp 98.6 F (37 C) (Oral)   Resp 16   Ht 5\' 2"  (1.575 m)   Wt 90.7 kg   LMP 05/30/2018   SpO2 100%   BMI 36.58 kg/m   Physical Exam  Constitutional: She appears well-developed and well-nourished. No distress.  HENT:  Head: Normocephalic and atraumatic.  Mouth/Throat: Oropharynx is clear and moist. No oropharyngeal exudate.  Eyes: Pupils are equal, round, and reactive to light. Conjunctivae are normal. Right eye exhibits no discharge. Left eye exhibits no discharge. No scleral icterus.  Neck: Normal range of motion. Neck supple. No thyromegaly present.  Cardiovascular: Normal rate, regular rhythm, normal heart sounds and intact distal pulses. Exam reveals no gallop and no friction rub.  No murmur heard. Pulmonary/Chest: Effort normal and breath sounds normal. No stridor. No respiratory distress. She  has no wheezes. She has no rales.  Abdominal: Soft. Bowel sounds are normal. She exhibits no distension. There is no tenderness. There is no rebound and no guarding.  Musculoskeletal: She exhibits no edema.  Healing right fifth distal fingertip amputation, no significant drainage, no erythema proximally, see photo  Lymphadenopathy:    She has no cervical adenopathy.  Neurological: She is alert. Coordination normal.  Skin: Skin is warm and dry. No rash noted. She is not diaphoretic. No pallor.  Psychiatric: She has a normal mood and affect.  Nursing note and vitals reviewed.          ED Treatments / Results  Labs (all labs ordered are listed, but only abnormal results are displayed) Labs Reviewed - No data to display  EKG None  Radiology No results found.  Procedures Procedures (including critical care time)  Medications Ordered  in ED Medications - No data to display   Initial Impression / Assessment and Plan / ED Course  I have reviewed the triage vital signs and the nursing notes.  Pertinent labs & imaging results that were available during my care of the patient were reviewed by me and considered in my medical decision making (see chart for details).     Patient with recent distal fingertip amputation her right fifth digit.  She was evaluated orthopedics 2 days ago and told wound was healing well.  Based on patient's picture she showed me of the wound earlier this morning, there did appear to be a little bit of yellow drainage, however no purulent drainage on my evaluation.  There is no surrounding erythema.  Patient is afebrile.  Will extend Keflex.  Follow-up to orthopedics as planned.  We will also provide more pain medication this is probably the reason patient's pain returned but she stopped taking the pain medication the day prior.  Continue ibuprofen and for severe pain, Norco.  I reviewed the [place] narcotic database and found no discrepancies.  Return precautions given.  Patient understands and agrees with plan.  Patient vitals stable throughout ED course and discharged in satisfactory condition. I discussed patient case with Dr. Juleen ChinaKohut who guided the patient's management and agrees with plan.   Final Clinical Impressions(s) / ED Diagnoses   Final diagnoses:  Visit for wound check    ED Discharge Orders         Ordered    cephALEXin (KEFLEX) 500 MG capsule  2 times daily,   Status:  Discontinued     06/05/18 1739    HYDROcodone-acetaminophen (NORCO/VICODIN) 5-325 MG tablet  Every 6 hours PRN     06/05/18 1739    cephALEXin (KEFLEX) 500 MG capsule  2 times daily     06/05/18 1948           Emi HolesLaw, Kitty Cadavid M, PA-C 06/05/18 1951    Raeford RazorKohut, Stephen, MD 06/09/18 [phone]

## 2018-06-05 NOTE — ED Triage Notes (Signed)
Pt reports R pinky finger earlier this week, she saw her dr on Friday and was told it was healing well. Today she woke up with pain and drainage.

## 2018-06-05 NOTE — Discharge Instructions (Signed)
Continue changing dressing daily.  Continue Keflex until completed.  Take Norco every 6 hours as prescribed, as needed for severe pain.  Take ibuprofen for mild to moderate pain.  Please follow-up with orthopedic doctor as planned.  Please have your doctor recheck your blood pressure during next visit.  It is most likely elevated due to pain.

## 2020-05-29 ENCOUNTER — Emergency Department (HOSPITAL_BASED_OUTPATIENT_CLINIC_OR_DEPARTMENT_OTHER)
Admission: EM | Admit: 2020-05-29 | Discharge: 2020-05-29 | Disposition: A | Discharge status: Home / Self Care | Payer: Self-pay | Attending: Emergency Medicine | Admitting: Emergency Medicine

## 2020-05-29 ENCOUNTER — Other Ambulatory Visit: Payer: Self-pay

## 2020-05-29 ENCOUNTER — Encounter (HOSPITAL_BASED_OUTPATIENT_CLINIC_OR_DEPARTMENT_OTHER): Payer: Self-pay

## 2020-05-29 DIAGNOSIS — Z711 Person with feared health complaint in whom no diagnosis is made: Secondary | ICD-10-CM

## 2020-05-29 DIAGNOSIS — F1590 Other stimulant use, unspecified, uncomplicated: Secondary | ICD-10-CM | POA: Insufficient documentation

## 2020-05-29 DIAGNOSIS — I10 Essential (primary) hypertension: Secondary | ICD-10-CM | POA: Insufficient documentation

## 2020-05-29 DIAGNOSIS — B3731 Acute candidiasis of vulva and vagina: Secondary | ICD-10-CM

## 2020-05-29 DIAGNOSIS — B373 Candidiasis of vulva and vagina: Secondary | ICD-10-CM

## 2020-05-29 DIAGNOSIS — A64 Unspecified sexually transmitted disease: Secondary | ICD-10-CM | POA: Insufficient documentation

## 2020-05-29 DIAGNOSIS — R102 Pelvic and perineal pain: Secondary | ICD-10-CM | POA: Insufficient documentation

## 2020-05-29 DIAGNOSIS — R300 Dysuria: Secondary | ICD-10-CM

## 2020-05-29 LAB — URINALYSIS, ROUTINE W REFLEX MICROSCOPIC
Bilirubin Urine: NEGATIVE
Glucose, UA: NEGATIVE mg/dL
Ketones, ur: NEGATIVE mg/dL
Nitrite: NEGATIVE
Protein, ur: NEGATIVE mg/dL
Specific Gravity, Urine: 1.015 (ref 1.005–1.030)
pH: 6.5 (ref 5.0–8.0)

## 2020-05-29 LAB — WET PREP, GENITAL
Sperm: NONE SEEN
Trich, Wet Prep: NONE SEEN

## 2020-05-29 LAB — URINALYSIS, MICROSCOPIC (REFLEX)

## 2020-05-29 LAB — PREGNANCY, URINE: Preg Test, Ur: NEGATIVE

## 2020-05-29 MED ORDER — METRONIDAZOLE 500 MG PO TABS: 500.0000 mg | ORAL_TABLET | Freq: Two times a day (BID) | ORAL | 0 refills | Status: AC

## 2020-05-29 MED ORDER — FLUCONAZOLE 150 MG PO TABS
150.0000 mg | ORAL_TABLET | Freq: Once | ORAL | Status: AC
  Administered 2020-05-29: 150 mg via ORAL
  Filled 2020-05-29: qty 1

## 2020-05-29 MED ORDER — CEFTRIAXONE SODIUM 500 MG IJ SOLR
500.0000 mg | Freq: Once | INTRAMUSCULAR | Status: AC
  Administered 2020-05-29: 500 mg via INTRAMUSCULAR
  Filled 2020-05-29: qty 500

## 2020-05-29 MED ORDER — DOXYCYCLINE HYCLATE 100 MG PO CAPS: 100.0000 mg | ORAL_CAPSULE | Freq: Two times a day (BID) | ORAL | 0 refills | Status: AC

## 2020-05-29 NOTE — ED Notes (Signed)
Vaginal area is itching and hurts when she urinates.

## 2020-05-29 NOTE — ED Notes (Signed)
ED Provider at bedside. 

## 2020-05-29 NOTE — ED Triage Notes (Signed)
Pt reports burning with urination, pelvic pain and itching since Sunday.

## 2020-05-29 NOTE — Discharge Instructions (Addendum)
Today you were treated for suspected yeast infection and possible STD. Take prescribed antibiotic as directed. A culture has been sent, you will be contacted of any positive results. Follow up with your PCP/OBGYN for re evaluation and further testing. If you have any worsening symptoms, worsening pain, lower abdominal pain, fevers, further concerns for your health please return promptly to an ER for re evaluation.

## 2020-05-29 NOTE — ED Provider Notes (Addendum)
[facility] EMERGENCY DEPARTMENT Provider Note   CSN: [id number] Arrival date & time: 05/29/20  0757     History Chief Complaint  Patient presents with  . Dysuria    Veronica Henry is a 39 y.o. female.  HPI   39 yo female presents today for vaginal itching/discomfort and dysuria. Patient states her symptoms started Sunday after she had sexual intercourse with a new partner. She has concern that she has contracted an STI. No admitted previous h/o STI. Denies vaginal bleeding, fever, lower abdominal/back/flank pain. Denies pregnancy or possibility of being pregnant.  Past Medical History:  Diagnosis Date  . Hypertension     There are no problems to display for this patient.   History reviewed. No pertinent surgical history.   OB History    Gravida  1   Para      Term      Preterm      AB      Living        SAB      TAB      Ectopic      Multiple      Live Births              History reviewed. No pertinent family history.  Social History   Tobacco Use  . Smoking status: Never Smoker  . Smokeless tobacco: Never Used  Substance Use Topics  . Alcohol use: Yes    Comment: social  . Drug use: Yes    Types: Marijuana    Home Medications Prior to Admission medications   Medication Sig Start Date End Date Taking? Authorizing Provider  HYDROcodone-acetaminophen (NORCO/VICODIN) 5-325 MG tablet Take 1-2 tablets by mouth every 6 (six) hours as needed. 06/05/18   Law, Waylan Boga, PA-C  ibuprofen (ADVIL,MOTRIN) 600 MG tablet Take 1 tablet (600 mg total) by mouth every 6 (six) hours as needed for mild pain. 04/07/15   Lyndal Pulley, MD    Allergies    Patient has no known allergies.  Review of Systems   Review of Systems  Constitutional: Negative for fatigue and fever.  Respiratory: Negative for shortness of breath.   Cardiovascular: Negative for chest pain.  Gastrointestinal: Negative for abdominal pain and rectal pain.    Genitourinary: Positive for dyspareunia, dysuria and vaginal pain. Negative for decreased urine volume, flank pain, genital sores, hematuria, pelvic pain, vaginal bleeding and vaginal discharge.       Vaginal itching  Musculoskeletal: Negative for myalgias.  Skin: Negative for rash.  Neurological: Negative for headaches.    Physical Exam Updated Vital Signs BP 119/72 (BP Location: Left Arm)   Pulse 88   Temp 98.7 F (37.1 C) (Oral)   Resp 18   Ht 5\' 2"  (1.575 m)   Wt 95.3 kg   LMP 05/09/2020   SpO2 98%   BMI 38.41 kg/m   Physical Exam HENT:     Head: Normocephalic.  Pulmonary:     Effort: Pulmonary effort is normal. No respiratory distress.  Abdominal:     General: Abdomen is flat.     Palpations: Abdomen is soft.  Genitourinary:    Comments: External vagina wnl, labia erythematous, tender to touch, speculum exam uncomfortable, thick white and grey discharge in vaginal canal, no odor, no bleeding, partial view of cervix which was erythematous, no unilateal pelvic/adnexal pain Musculoskeletal:        General: No signs of injury.  Skin:    General: Skin is warm.  Neurological:  Mental Status: She is alert and oriented to person, place, and time. Mental status is at baseline.  Psychiatric:        Mood and Affect: Mood normal.     ED Results / Procedures / Treatments   Labs (all labs ordered are listed, but only abnormal results are displayed) Labs Reviewed  PREGNANCY, URINE  URINALYSIS, ROUTINE W REFLEX MICROSCOPIC    EKG None  Radiology No results found.  Procedures Procedures (including critical care time)  Medications Ordered in ED Medications - No data to display  ED Course  I have reviewed the triage vital signs and the nursing notes.  Pertinent labs & imaging results that were available during my care of the patient were reviewed by me and considered in my medical decision making (see chart for details).  Clinical Course as of May 30 911   Wed May 29, 2020  0910 UA is LE positive with WBC and many bacteria   [KH]    Clinical Course User Index [KH] Deren Degrazia, Clabe Seal, DO   MDM Rules/Calculators/A&P                          Patient presents for vaginal itching, pain and dysuria. No abdominal TTP, pelvic reveals erythematous labia with thick white and thin gray discharge. No strong odor, no adnexal pain/CMT. UA shows bacteria, WBC and LE+. Wet prep shows yeast and clue cells consistent with yeast infection and BV. I suspect patient has a yeast infection with BV and possible exposure to gonorrhea/chlamydia. Low suspicion for trich and PID given presentation and exam. Patient would like treatment for GC/chlamydia. Patient will be treated today for yeast infection, BV and chlamydia/ghonorrhea. Patient advised no alcohol with antibiotics.  Final Clinical Impression(s) / ED Diagnoses Final diagnoses:  None    Rx / DC Orders ED Discharge Orders    None       Rozelle Logan, DO 05/29/20 0959    Rozelle Logan, DO 05/29/20 1002

## 2020-05-30 LAB — GC/CHLAMYDIA PROBE AMP (~~LOC~~) NOT AT ARMC
Chlamydia: NEGATIVE
Comment: NEGATIVE
Comment: NORMAL
Neisseria Gonorrhea: NEGATIVE

## 2020-05-30 LAB — URINE CULTURE
Culture: <10000 — AB
Special Requests: NORMAL

## 2020-06-04 ENCOUNTER — Emergency Department (HOSPITAL_BASED_OUTPATIENT_CLINIC_OR_DEPARTMENT_OTHER): Payer: No Typology Code available for payment source

## 2020-06-04 ENCOUNTER — Other Ambulatory Visit: Payer: Self-pay

## 2020-06-04 ENCOUNTER — Encounter (HOSPITAL_BASED_OUTPATIENT_CLINIC_OR_DEPARTMENT_OTHER): Payer: Self-pay | Admitting: *Deleted

## 2020-06-04 ENCOUNTER — Emergency Department (HOSPITAL_BASED_OUTPATIENT_CLINIC_OR_DEPARTMENT_OTHER)
Admission: EM | Admit: 2020-06-04 | Discharge: 2020-06-04 | Disposition: A | Discharge status: Home / Self Care | Payer: No Typology Code available for payment source | Attending: Emergency Medicine | Admitting: Emergency Medicine

## 2020-06-04 DIAGNOSIS — M79632 Pain in left forearm: Secondary | ICD-10-CM | POA: Diagnosis not present

## 2020-06-04 DIAGNOSIS — M79642 Pain in left hand: Secondary | ICD-10-CM | POA: Diagnosis not present

## 2020-06-04 DIAGNOSIS — M25532 Pain in left wrist: Secondary | ICD-10-CM | POA: Diagnosis not present

## 2020-06-04 DIAGNOSIS — Y92410 Unspecified street and highway as the place of occurrence of the external cause: Secondary | ICD-10-CM | POA: Diagnosis not present

## 2020-06-04 DIAGNOSIS — I10 Essential (primary) hypertension: Secondary | ICD-10-CM | POA: Insufficient documentation

## 2020-06-04 MED ORDER — IBUPROFEN 400 MG PO TABS
600.0000 mg | ORAL_TABLET | Freq: Once | ORAL | Status: AC
  Administered 2020-06-04: 600 mg via ORAL
  Filled 2020-06-04: qty 1

## 2020-06-04 NOTE — Discharge Instructions (Signed)
NeededUse ibuprofen and Tylenol as needed for pain.  Wear wrist splint for support and comfort, you can take this off, your x-ray showed no fractures today.  If the pain over the thumb side of your wrist does not improve after about a week you should call to schedule close follow-up with Dr. Amanda Pea, you could have a scaphoid fracture, sometimes these do not show up on initial imaging.
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
## 2021-07-11 ENCOUNTER — Emergency Department (HOSPITAL_BASED_OUTPATIENT_CLINIC_OR_DEPARTMENT_OTHER)
Admission: EM | Admit: 2021-07-11 | Discharge: 2021-07-11 | Disposition: A | Discharge status: Home / Self Care | Payer: Medicaid Other | Attending: Emergency Medicine | Admitting: Emergency Medicine

## 2021-07-11 ENCOUNTER — Other Ambulatory Visit: Payer: Self-pay

## 2021-07-11 ENCOUNTER — Encounter (HOSPITAL_BASED_OUTPATIENT_CLINIC_OR_DEPARTMENT_OTHER): Payer: Self-pay

## 2021-07-11 DIAGNOSIS — Z5321 Procedure and treatment not carried out due to patient leaving prior to being seen by health care provider: Secondary | ICD-10-CM | POA: Insufficient documentation

## 2021-07-11 DIAGNOSIS — R040 Epistaxis: Secondary | ICD-10-CM | POA: Insufficient documentation

## 2021-07-11 NOTE — ED Triage Notes (Signed)
Pt c/o intermittent nosebleeds started yesterday-has tissues in both nostrils-NAD-steady gait

## 2021-07-11 NOTE — ED Notes (Signed)
Called for x 3 in lobby with no answer

## 2023-05-18 ENCOUNTER — Other Ambulatory Visit: Payer: Self-pay

## 2023-05-18 ENCOUNTER — Emergency Department (HOSPITAL_BASED_OUTPATIENT_CLINIC_OR_DEPARTMENT_OTHER): Payer: Commercial Managed Care - HMO

## 2023-05-18 ENCOUNTER — Emergency Department (HOSPITAL_BASED_OUTPATIENT_CLINIC_OR_DEPARTMENT_OTHER)
Admission: EM | Admit: 2023-05-18 | Discharge: 2023-05-18 | Disposition: A | Discharge status: Home / Self Care | Payer: Commercial Managed Care - HMO | Attending: Emergency Medicine | Admitting: Emergency Medicine

## 2023-05-18 ENCOUNTER — Encounter (HOSPITAL_BASED_OUTPATIENT_CLINIC_OR_DEPARTMENT_OTHER): Payer: Self-pay | Admitting: Urology

## 2023-05-18 DIAGNOSIS — R0789 Other chest pain: Secondary | ICD-10-CM | POA: Diagnosis not present

## 2023-05-18 DIAGNOSIS — R079 Chest pain, unspecified: Secondary | ICD-10-CM

## 2023-05-18 LAB — BASIC METABOLIC PANEL
Anion gap: 7 (ref 5–15)
BUN: 12 mg/dL (ref 6–20)
CO2: 24 mmol/L (ref 22–32)
Calcium: 8.7 mg/dL — ABNORMAL LOW (ref 8.9–10.3)
Chloride: 106 mmol/L (ref 98–111)
Creatinine, Ser: 0.68 mg/dL (ref 0.44–1.00)
GFR, Estimated: >60 mL/min (ref >60)
Glucose, Bld: 99 mg/dL (ref 70–99)
Potassium: 3.9 mmol/L (ref 3.5–5.1)
Sodium: 137 mmol/L (ref 135–145)

## 2023-05-18 LAB — CBC
HCT: 37.4 % (ref 36.0–46.0)
Hemoglobin: 12.6 g/dL (ref 12.0–15.0)
MCH: 25.5 pg — ABNORMAL LOW (ref 26.0–34.0)
MCHC: 33.7 g/dL (ref 30.0–36.0)
MCV: 75.7 fL — ABNORMAL LOW (ref 80.0–100.0)
Platelets: 242 10*3/uL (ref 150–400)
RBC: 4.94 MIL/uL (ref 3.87–5.11)
RDW: 14.2 % (ref 11.5–15.5)
WBC: 8.5 10*3/uL (ref 4.0–10.5)
nRBC: 0 % (ref 0.0–0.2)

## 2023-05-18 LAB — TROPONIN I (HIGH SENSITIVITY)
Troponin I (High Sensitivity): 2 ng/L (ref <18)
Troponin I (High Sensitivity): <2 ng/L (ref <18)

## 2023-05-18 MED ORDER — KETOROLAC TROMETHAMINE 15 MG/ML IJ SOLN
15.0000 mg | Freq: Once | INTRAMUSCULAR | Status: AC
  Administered 2023-05-18: 15 mg via INTRAVENOUS
  Filled 2023-05-18: qty 1

## 2023-05-18 MED ORDER — CELECOXIB 200 MG PO CAPS: 200.0000 mg | ORAL_CAPSULE | Freq: Two times a day (BID) | ORAL | 0 refills | Status: AC

## 2023-05-18 MED ORDER — FAMOTIDINE 20 MG PO TABS
20.0000 mg | ORAL_TABLET | Freq: Once | ORAL | Status: AC
  Administered 2023-05-18: 20 mg via ORAL
  Filled 2023-05-18: qty 1

## 2023-05-18 NOTE — ED Triage Notes (Signed)
Right sided chest pain that started this am that woke her up from sleep  Took tylenol with no relief

## 2023-05-18 NOTE — ED Provider Notes (Signed)
[facility]ERGENCY DEPARTMENT AT [facility] Provider Note   CSN: [id number] Arrival date & time: 05/18/23  1651     History Chief Complaint  Patient presents with   Chest Pain    HPI Veronica Henry is a 42 y.o. female presenting for . History of Present Illness: Woke up at 1:00 AM with severe right-sided chest pain. Pain worsens with deep breaths and when lying on the affected side. Attempted to alleviate pain with Tylenol without success. Pain has persisted throughout the day, exacerbating with movement. Localized to the right side of the chest. No abdominal pain noted. Works on [employer] for 10-hour shifts.  Patient's recorded medical, surgical, social, medication list and allergies were reviewed in the Snapshot window as part of the initial history.   Review of Systems   Review of Systems  Constitutional:  Negative for chills and fever.  HENT:  Negative for ear pain and sore throat.   Eyes:  Negative for pain and visual disturbance.  Respiratory:  Negative for cough and shortness of breath.   Cardiovascular:  Positive for chest pain. Negative for palpitations.  Gastrointestinal:  Negative for abdominal pain and vomiting.  Genitourinary:  Negative for dysuria and hematuria.  Musculoskeletal:  Negative for arthralgias and back pain.  Skin:  Negative for color change and rash.  Neurological:  Negative for seizures and syncope.  All other systems reviewed and are negative.   Physical Exam Updated Vital Signs BP (!) 143/106   Pulse 86   Temp 97.7 F (36.5 C) (Oral)   Resp 18   Ht 5\' 2"  (1.575 m)   Wt 104.3 kg   LMP 05/17/2023   SpO2 100%   BMI 42.07 kg/m  Physical Exam Vitals and nursing note reviewed.  Constitutional:      General: She is not in acute distress.    Appearance: She is well-developed.  HENT:     Head: Normocephalic and atraumatic.  Eyes:     Conjunctiva/sclera: Conjunctivae normal.  Cardiovascular:     Rate and Rhythm: Normal rate  and regular rhythm.     Heart sounds: No murmur heard. Pulmonary:     Effort: Pulmonary effort is normal. No respiratory distress.     Breath sounds: Normal breath sounds.  Abdominal:     Palpations: Abdomen is soft.     Tenderness: There is no abdominal tenderness.  Musculoskeletal:        General: No swelling.     Cervical back: Neck supple.  Skin:    General: Skin is warm and dry.     Capillary Refill: Capillary refill takes less than 2 seconds.  Neurological:     Mental Status: She is alert.  Psychiatric:        Mood and Affect: Mood normal.      ED Course/ Medical Decision Making/ A&P    Procedures Procedures   Medications Ordered in ED Medications  ketorolac (TORADOL) 15 MG/ML injection 15 mg (15 mg Intravenous Given 05/18/23 1900)  famotidine (PEPCID) tablet 20 mg (20 mg Oral Given 05/18/23 1902)   Medical Decision Making: Veronica Henry is a 42 y.o. female who presented to the ED today with chest pain, detailed above.  Based on patient's comorbidities, patient has a heart score of 2.    Patient placed on continuous vitals and telemetry monitoring while in ED which was reviewed periodically.  Complete initial physical exam performed, notably the patient was HDS in NAD.   Reviewed and confirmed nursing documentation for  past medical history, family history, social history.    Initial Assessment: With the patient's presentation of left-sided chest pain, most likely diagnosis is musculoskeletal chest pain versus GERD, although ACS remains on the differential. Other diagnoses were considered including (but not limited to) pulmonary embolism, community-acquired pneumonia, aortic dissection, pneumothorax, underlying bony abnormality, anemia. These are considered less likely due to history of present illness and physical exam findings.    In particular, concerning pulmonary embolism: Patient is PERC NEGATIVE and the they deny malignancy, recent surgery, history of DVT, or  calf tenderness leading to a low risk Wells score. Aortic Dissection also reconsidered but seems less likely based on the location, quality, onset, and severity of symptoms in this case. Patient has a lack of serious comorbidities for this condition including a lack of HTN or Smoking. Patient also has a lack of underlying history of AD or TAA.  This is most consistent with an acute life/limb threatening illness complicated by underlying chronic conditions.   Initial Plan: Evaluate for ACS with delta troponin and EKG evaluated as below  Evaluate for dissection, bony abnormality, or pneumonia with chest x-ray and screening laboratory evaluation including CBC, BMP  Further evaluation for pulmonary embolism not indicated at this time based on patient's PERC and Wells score.  Further evaluation for Thoracic Aortic Dissection not indicated at this time based on patient's clinical history and PE findings.   Initial Study Results: EKG was reviewed independently. Rate, rhythm, axis, intervals all examined and without medically relevant abnormality. ST segments without concerns for elevations.    Laboratory  Delta troponin demonstrated no acute abnormality  CBC and BMP without obvious metabolic or inflammatory abnormalities requiring further evaluation   Radiology  DG Chest 2 View  Result Date: 05/18/2023 CLINICAL DATA:  Chest pain. EXAM: CHEST - 2 VIEW COMPARISON:  December 11, 2015 FINDINGS: The heart size and mediastinal contours are within normal limits. Both lungs are clear. The visualized skeletal structures are unremarkable. IMPRESSION: No active cardiopulmonary disease. Electronically Signed   By: Aram Candela M.D.   On: 05/18/2023 21:26    Final Assessment and Plan: On repeat clinical assessment, patient has had complete symptomatic resolution after administration of Pepcid and Toradol.  They are currently ambulatory tolerating p.o. intake.  They deny fevers or chills, nausea vomiting,  syncope shortness of breath.  Favor likely musculoskeletal versus gastroesophageal etiology of patient's symptoms.  Considered ACS grossly less likely given resolution, well appearance and negative troponin and low HEART score.  Patient to follow-up with primary care provider within 48 hours for ongoing care and management.     Disposition:  I have considered need for hospitalization, however, considering all of the above, I believe this patient is stable for discharge at this time.  Patient/family educated about specific return precautions for given chief complaint and symptoms.  Patient/family educated about follow-up with PCP    Patient/family expressed understanding of return precautions and need for follow-up. Patient spoken to regarding all imaging and laboratory results and appropriate follow up for these results. All education provided in verbal form with additional information in written form. Time was allowed for answering of patient questions. Patient discharged.    Emergency Department Medication Summary:   Medications  ketorolac (TORADOL) 15 MG/ML injection 15 mg (15 mg Intravenous Given 05/18/23 1900)  famotidine (PEPCID) tablet 20 mg (20 mg Oral Given 05/18/23 1902)      Clinical Impression:  1. Chest pain, unspecified type      Discharge  Final Clinical Impression(s) / ED Diagnoses Final diagnoses:  Chest pain, unspecified type    Rx / DC Orders ED Discharge Orders          Ordered    celecoxib (CELEBREX) 200 MG capsule  2 times daily        05/18/23 2221              Glyn Ade, MD 05/18/23 2223
